# Patient Record
Sex: Male | Born: 1995 | Race: White | Hispanic: No | Marital: Single | Smoking: Current every day smoker
Health system: Southern US, Community
[De-identification: ages and names within clinical notes are randomized; demographics above are authoritative.]

---

## 2019-08-04 ENCOUNTER — Encounter: Payer: Self-pay | Admitting: Emergency Medicine

## 2019-08-04 ENCOUNTER — Emergency Department: Payer: No Typology Code available for payment source

## 2019-08-04 ENCOUNTER — Other Ambulatory Visit: Payer: Self-pay

## 2019-08-04 ENCOUNTER — Emergency Department
Admission: EM | Admit: 2019-08-04 | Discharge: 2019-08-04 | Disposition: A | Discharge status: Home / Self Care | Payer: No Typology Code available for payment source | Attending: Emergency Medicine | Admitting: Emergency Medicine

## 2019-08-04 DIAGNOSIS — F17210 Nicotine dependence, cigarettes, uncomplicated: Secondary | ICD-10-CM | POA: Diagnosis not present

## 2019-08-04 DIAGNOSIS — R0789 Other chest pain: Secondary | ICD-10-CM | POA: Insufficient documentation

## 2019-08-04 DIAGNOSIS — Y92410 Unspecified street and highway as the place of occurrence of the external cause: Secondary | ICD-10-CM | POA: Diagnosis not present

## 2019-08-04 DIAGNOSIS — Y93I9 Activity, other involving external motion: Secondary | ICD-10-CM | POA: Insufficient documentation

## 2019-08-04 DIAGNOSIS — Y999 Unspecified external cause status: Secondary | ICD-10-CM | POA: Diagnosis not present

## 2019-08-04 MED ORDER — LIDOCAINE 5 % EX PTCH
1.0000 | MEDICATED_PATCH | CUTANEOUS | Status: DC
  Administered 2019-08-04: 1 via TRANSDERMAL
  Filled 2019-08-04: qty 1

## 2019-08-04 MED ORDER — CYCLOBENZAPRINE HCL 10 MG PO TABS: 10.0000 mg | ORAL_TABLET | Freq: Three times a day (TID) | ORAL | 0 refills | Status: AC | PRN

## 2019-08-04 MED ORDER — IBUPROFEN 600 MG PO TABS: 600.0000 mg | ORAL_TABLET | Freq: Three times a day (TID) | ORAL | 0 refills | Status: AC | PRN
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
## 2019-08-04 NOTE — Discharge Instructions (Addendum)
Follow discharge care instruction take medication as directed.  Wear Lidoderm patch for 12 hours. °
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
